# Patient Record
Sex: Female | Born: 1955 | Race: White | Hispanic: No | Marital: Married | Smoking: Never smoker
Health system: Southern US, Community
[De-identification: ages and names within clinical notes are randomized; demographics above are authoritative.]

## PROBLEM LIST (undated history)

## (undated) DIAGNOSIS — D0461 Carcinoma in situ of skin of right upper limb, including shoulder: Secondary | ICD-10-CM

## (undated) DIAGNOSIS — G4700 Insomnia, unspecified: Secondary | ICD-10-CM

## (undated) DIAGNOSIS — M1990 Unspecified osteoarthritis, unspecified site: Secondary | ICD-10-CM

## (undated) DIAGNOSIS — F39 Unspecified mood [affective] disorder: Secondary | ICD-10-CM

## (undated) DIAGNOSIS — I340 Nonrheumatic mitral (valve) insufficiency: Secondary | ICD-10-CM

## (undated) DIAGNOSIS — G4720 Circadian rhythm sleep disorder, unspecified type: Secondary | ICD-10-CM

## (undated) DIAGNOSIS — G609 Hereditary and idiopathic neuropathy, unspecified: Secondary | ICD-10-CM

## (undated) DIAGNOSIS — I341 Nonrheumatic mitral (valve) prolapse: Secondary | ICD-10-CM

## (undated) HISTORY — DX: Insomnia, unspecified: G47.00

## (undated) HISTORY — DX: Unspecified mood (affective) disorder: F39

## (undated) HISTORY — DX: Nonrheumatic mitral (valve) insufficiency: I34.0

## (undated) HISTORY — PX: CHOLECYSTECTOMY: SHX55

## (undated) HISTORY — DX: Circadian rhythm sleep disorder, unspecified type: G47.20

## (undated) HISTORY — DX: Unspecified osteoarthritis, unspecified site: M19.90

## (undated) HISTORY — DX: Carcinoma in situ of skin of right upper limb, including shoulder: D04.61

## (undated) HISTORY — DX: Hereditary and idiopathic neuropathy, unspecified: G60.9

## (undated) HISTORY — PX: JOINT REPLACEMENT: SHX530

## (undated) HISTORY — DX: Nonrheumatic mitral (valve) prolapse: I34.1

---

## 1997-12-27 HISTORY — PX: VAGINAL HYSTERECTOMY: SUR661

## 1998-07-16 ENCOUNTER — Ambulatory Visit (HOSPITAL_COMMUNITY): Admission: RE | Admit: 1998-07-16 | Discharge: 1998-07-16 | Payer: Self-pay | Admitting: Obstetrics & Gynecology

## 1999-08-17 ENCOUNTER — Other Ambulatory Visit: Admission: RE | Admit: 1999-08-17 | Discharge: 1999-08-17 | Payer: Self-pay | Admitting: Obstetrics & Gynecology

## 2000-08-11 ENCOUNTER — Other Ambulatory Visit: Admission: RE | Admit: 2000-08-11 | Discharge: 2000-08-11 | Payer: Self-pay | Admitting: Obstetrics & Gynecology

## 2001-11-02 ENCOUNTER — Other Ambulatory Visit: Admission: RE | Admit: 2001-11-02 | Discharge: 2001-11-02 | Payer: Self-pay | Admitting: Obstetrics & Gynecology

## 2002-11-09 ENCOUNTER — Other Ambulatory Visit: Admission: RE | Admit: 2002-11-09 | Discharge: 2002-11-09 | Payer: Self-pay | Admitting: Obstetrics & Gynecology

## 2003-11-18 ENCOUNTER — Other Ambulatory Visit: Admission: RE | Admit: 2003-11-18 | Discharge: 2003-11-18 | Payer: Self-pay | Admitting: Obstetrics & Gynecology

## 2004-12-09 ENCOUNTER — Other Ambulatory Visit: Admission: RE | Admit: 2004-12-09 | Discharge: 2004-12-09 | Payer: Self-pay | Admitting: Obstetrics & Gynecology

## 2006-01-06 ENCOUNTER — Other Ambulatory Visit: Admission: RE | Admit: 2006-01-06 | Discharge: 2006-01-06 | Payer: Self-pay | Admitting: Obstetrics & Gynecology

## 2008-03-08 ENCOUNTER — Encounter: Admission: RE | Admit: 2008-03-08 | Discharge: 2008-03-08 | Payer: Self-pay | Admitting: Obstetrics & Gynecology

## 2010-03-20 ENCOUNTER — Encounter: Admission: RE | Admit: 2010-03-20 | Discharge: 2010-03-20 | Payer: Self-pay | Admitting: Obstetrics & Gynecology

## 2013-02-21 ENCOUNTER — Other Ambulatory Visit: Payer: Self-pay | Admitting: Family Medicine

## 2013-02-21 DIAGNOSIS — N644 Mastodynia: Secondary | ICD-10-CM

## 2013-03-07 ENCOUNTER — Ambulatory Visit
Admission: RE | Admit: 2013-03-07 | Discharge: 2013-03-07 | Disposition: A | Discharge status: Home / Self Care | Payer: BC Managed Care – PPO | Source: Ambulatory Visit | Attending: Family Medicine | Admitting: Family Medicine

## 2013-03-07 ENCOUNTER — Other Ambulatory Visit: Payer: Self-pay | Admitting: Family Medicine

## 2013-03-07 DIAGNOSIS — N644 Mastodynia: Secondary | ICD-10-CM

## 2013-03-09 ENCOUNTER — Ambulatory Visit
Admission: RE | Admit: 2013-03-09 | Discharge: 2013-03-09 | Disposition: A | Discharge status: Home / Self Care | Payer: BC Managed Care – PPO | Source: Ambulatory Visit | Attending: Family Medicine | Admitting: Family Medicine

## 2013-03-09 DIAGNOSIS — N644 Mastodynia: Secondary | ICD-10-CM

## 2013-03-09 HISTORY — PX: LYMPH NODE BIOPSY: SHX201

## 2015-07-10 ENCOUNTER — Other Ambulatory Visit: Payer: Self-pay | Admitting: Obstetrics & Gynecology

## 2015-07-11 LAB — CYTOLOGY - PAP

## 2017-07-14 ENCOUNTER — Other Ambulatory Visit: Payer: Self-pay | Admitting: Obstetrics & Gynecology

## 2017-07-14 DIAGNOSIS — R928 Other abnormal and inconclusive findings on diagnostic imaging of breast: Secondary | ICD-10-CM

## 2017-07-22 ENCOUNTER — Ambulatory Visit
Admission: RE | Admit: 2017-07-22 | Discharge: 2017-07-22 | Disposition: A | Discharge status: Home / Self Care | Payer: BC Managed Care – PPO | Source: Ambulatory Visit | Attending: Obstetrics & Gynecology | Admitting: Obstetrics & Gynecology

## 2017-07-22 ENCOUNTER — Other Ambulatory Visit: Payer: Self-pay | Admitting: Obstetrics & Gynecology

## 2017-07-22 DIAGNOSIS — R928 Other abnormal and inconclusive findings on diagnostic imaging of breast: Secondary | ICD-10-CM

## 2017-07-22 DIAGNOSIS — N6320 Unspecified lump in the left breast, unspecified quadrant: Secondary | ICD-10-CM

## 2018-01-04 ENCOUNTER — Encounter: Payer: Self-pay | Admitting: Cardiology

## 2018-01-04 DIAGNOSIS — R079 Chest pain, unspecified: Secondary | ICD-10-CM | POA: Diagnosis not present

## 2018-11-02 NOTE — Progress Notes (Signed)
Cardiology Office Note:    Date:  11/03/2018   ID:  Felicia Shields, DOB 03/16/1956, MRN [id number]  PCP:  Mateo Flow, MD  Cardiologist:  Shirlee More, MD   Referring MD: Mateo Flow, MD  ASSESSMENT:    1. MVP (mitral valve prolapse)   2. Fatigue, unspecified type    PLAN:    In order of problems listed above:  1. She has mitral valve prolapse clinically has mild regurgitation now offered her reassurance to see back in a year we will recheck echocardiogram but I did ask her to start endocarditis prophylaxis with more than recurrent mild regurgitation by echo.  Certainly people can have atypical chest pain with prolapse but I do not think is the cause of her symptoms.  If she had more typical Jonny should benefit from cardiac CTA at this time I do not think she would needs to wear heart rhythm monitor. 2. Multiple potential etiologies occluding her sleep disturbance centrally active drugs including Ambien Neurontin Klonopin depression.  Next appointment 1 year   Medication Adjustments/Labs and Tests Ordered: Current medicines are reviewed at length with the patient today.  Concerns regarding medicines are outlined above.  No orders of the defined types were placed in this encounter.  No orders of the defined types were placed in this encounter.    Chief Complaint  Patient presents with  . Mitral Valve Prolapse    History of Present Illness:    Felicia Shields is a 62 y.o. female who is being seen today for the evaluation of atrial regurgitation at the request of Mateo Flow, MD.  She is had a difficult year with deaths in the family.  Associated with grief she had nonexertional chest pain underwent a stress echo [facility] which was negative for ischemia but showed posterior leaflet prolapse was felt to be moderate regurgitation.  Subsequently another family member died of stroke due to atrial fibrillation has made her very concerned about her cardiac prognosis.   He has fatigue which is a chronic problem has sleep disturbance is not having typical angina shortness of breath edema or syncope she has palpitation intermittently not severe not sustained. Past Medical History:  Diagnosis Date  . 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 Episodic mood disorder ([place])   . Idiopathic peripheral neuropathy   . Mitral regurgitation   . MVP (mitral valve prolapse)   . 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 CHOLECYSTECTOMY    . JOINT REPLACEMENT     Thumb surgery  . LYMPH NODE BIOPSY  03/09/2013  . VAGINAL HYSTERECTOMY  1999    Current Medications: Current Meds  Medication Sig  . calcium carbonate (OS-CAL) 600 MG tablet Take 1 tablet by mouth daily.  . cetirizine (ZYRTEC) 10 MG tablet Take 10 mg by mouth daily as needed for allergies.  . Cholecalciferol (VITAMIN D3) 50 MCG (2000 UT) capsule Take 2,000 Units by mouth daily.  . clonazePAM (KLONOPIN) 0.5 MG tablet Take 0.5 mg by mouth at bedtime.  . COLLAGEN PO Take 1,000 mg by mouth daily.  Marland Kitchen estradiol (ESTRACE) 2 MG tablet Take 2 mg by mouth daily.   Marland Kitchen gabapentin (NEURONTIN) 300 MG capsule Take 1-2 capsules by mouth at bedtime.  . magnesium oxide (MAG-OX) 400 MG tablet Take 400 mg by mouth 2 (two) times daily.   . meloxicam (MOBIC) 15  MG tablet Take 1 tablet by mouth as needed.  . Multiple Vitamin (MULTIVITAMIN) tablet Take 1 tablet by mouth daily.  . Turmeric 500 MG TABS Take 1 tablet by mouth daily.   Marland Kitchen zolpidem (AMBIEN) 10 MG tablet Take 10 mg by mouth at bedtime as needed.     Allergies:   Patient has no known allergies.   Social History   Socioeconomic History  . Marital status: Married    Spouse name: Not on file  . Number of children: 2  . Years of education: Not on file  . Highest education level: Not on file  Occupational History  . 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 Financial resource strain: Not  on file  . Food insecurity:    Worry: Not on file    Inability: Not on file  . Transportation needs:    Medical: Not on file    Non-medical: Not on file  Tobacco Use  . Smoking status: Never Smoker  . Smokeless tobacco: Never Used  Substance and Sexual Activity  . Alcohol use: Yes    Comment: "very little"  . Drug use: Never  . Sexual activity: Not on file  Lifestyle  . Physical activity:    Days per week: Not on file    Minutes per session: Not on file  . Stress: Not on file  Relationships  . Social connections:    Talks on phone: Not on file    Gets together: Not on file    Attends religious service: Not on file    Active member of club or organization: Not on file    Attends meetings of clubs or organizations: Not on file    Relationship status: Not on file  Other Topics Concern  . Not on file  Social History Narrative  . Not on file     Family History: The patient's family history includes Diabetes Mellitus II in her mother; Stroke in her brother.  ROS:   Review of Systems  Constitution: Positive for malaise/fatigue.  HENT: Negative.   Eyes: Negative.   Cardiovascular: Positive for chest pain and palpitations.  Respiratory: Negative.   Endocrine: Negative.   Hematologic/Lymphatic: Negative.   Skin: Negative.   Musculoskeletal: Positive for joint pain and myalgias.  Gastrointestinal: Negative.   Genitourinary: Negative.   Neurological: Negative.   Psychiatric/Behavioral: Positive for depression.  Allergic/Immunologic: Negative.    Please see the history of present illness.     All other systems reviewed and are negative.  EKGs/Labs/Other Studies Reviewed:    The following studies were reviewed today:   EKG:  EKG is  ordered today.  The ekg ordered today demonstrates sinus rhythm normal  Recent Labs: No results found for requested labs within last 8760 hours.  Recent Lipid Panel No results found for: CHOL, TRIG, HDL, CHOLHDL, VLDL, LDLCALC,  LDLDIRECT  Physical Exam:    VS:  BP 110/60 (BP Location: Left Arm, Patient Position: Sitting, Cuff Size: Normal)   Pulse 71   Ht 5\' 8"  (1.727 m)   Wt 168 lb 12.8 oz (76.6 kg)   SpO2 98%   BMI 25.67 kg/m     Wt Readings from Last 3 Encounters:  11/03/18 168 lb 12.8 oz (76.6 kg)     GEN:  Well nourished, well developed in no acute distress HEENT: Normal NECK: No JVD; No carotid bruits LYMPHATICS: No lymphadenopathy CARDIAC: 1/6 MR at [facility], no murmurs, rubs, gallops RESPIRATORY:  Clear to auscultation without rales, wheezing or  rhonchi  ABDOMEN: Soft, non-tender, non-distended MUSCULOSKELETAL:  No edema; No deformity  SKIN: Warm and dry NEUROLOGIC:  Alert and oriented x 3 PSYCHIATRIC:  Normal affect     Signed, Shirlee More, MD  11/03/2018 2:28 PM    [place]

## 2018-11-03 ENCOUNTER — Encounter: Payer: Self-pay | Admitting: *Deleted

## 2018-11-03 ENCOUNTER — Ambulatory Visit: Payer: BC Managed Care – PPO | Admitting: Cardiology

## 2018-11-03 ENCOUNTER — Encounter: Payer: Self-pay | Admitting: Cardiology

## 2018-11-03 VITALS — BP 110/60 | HR 71 | Ht 68.0 in | Wt 168.8 lb

## 2018-11-03 DIAGNOSIS — R5383 Other fatigue: Secondary | ICD-10-CM | POA: Diagnosis not present

## 2018-11-03 DIAGNOSIS — R079 Chest pain, unspecified: Secondary | ICD-10-CM

## 2018-11-03 DIAGNOSIS — I340 Nonrheumatic mitral (valve) insufficiency: Secondary | ICD-10-CM

## 2018-11-03 DIAGNOSIS — I341 Nonrheumatic mitral (valve) prolapse: Secondary | ICD-10-CM

## 2018-11-03 HISTORY — DX: Other fatigue: R53.83

## 2018-11-03 HISTORY — DX: Chest pain, unspecified: R07.9

## 2018-11-03 MED ORDER — AMOXICILLIN 500 MG PO TABS: ORAL_TABLET | ORAL | 0 refills | Status: DC

## 2018-11-03 NOTE — Patient Instructions (Addendum)
Medication Instructions:  Your physician has recommended you make the following change in your medication:  START amoxicillin 500 mg: Take 4 tablets (2,000 mg) 45 minutes before dental cleaning/procedures  If you need a refill on your cardiac medications before your next appointment, please call your pharmacy.   Lab work: None  If you have labs (blood work) drawn today and your tests are completely normal, you will receive your results only by: Marland Kitchen MyChart Message (if you have MyChart) OR . A paper copy in the mail If you have any lab test that is abnormal or we need to change your treatment, we will call you to review the results.  Testing/Procedures: You had an EKG today.   Follow-Up: At [facility], you and your health needs are our priority.  As part of our continuing mission to provide you with exceptional heart care, we have created designated Provider Care Teams.  These Care Teams include your primary Cardiologist (physician) and Advanced Practice Providers (APPs -  Physician Assistants and Nurse Practitioners) who all work together to provide you with the care you need, when you need it. You will need a follow up appointment in 1 years.  Please call our office 2 months in advance to schedule this appointment.        Mitral Valve Prolapse Mitral valve prolapse is a heart condition involving the mitral valve. This is the valve between the upper chamber (atrium) and the lower chamber (ventricle) on the left side of the heart. Normally, the mitral valve allows blood to flow from the atrium to the ventricle and then seals off the chambers from one another. If you have mitral valve prolapse, the valve does not work the way that it should. The flaps of the mitral valve do not form a tight seal between the atrium and the ventricle. When this happens, blood can flow the wrong way (mitral valve regurgitation). This causes symptoms of mitral valve prolapse. This condition can develop  if:  The mitral valve flaps are larger and thicker than normal.  The valve opening stretches abnormally.  The valve flaps flop or bulge more than they should.  What are the causes? The cause of this condition is not known. However:  It is sometimes passed down (inherited) from a family member who also had the condition.  It can be a complication of other diseases.  What increases the risk? This condition is more like to develop in people with:  A family history of mitral valve prolapse.  Muscular dystrophy.  Graves disease.  Scoliosis.  A connective tissue disorder.  What are the signs or symptoms? Symptoms of this condition include:  A fast or irregular heartbeat (palpitations).  Fatigue.  Dizziness.  Shortness of breath.  Discomfort in the chest area.  In some cases, there are no symptoms for this condition. How is this diagnosed? This condition may be diagnosed based on:  Your symptoms and medical history.  A physical exam, which includes listening to your heart with a stethoscope.  Other tests to confirm the diagnosis. These may include imaging studies of your heart, such as: ? X-rays. These check for fluid in the lungs. ? Echocardiogram. This test uses sound waves to show the size of your heart and how well it pumps. ? Doppler ultrasound. This test uses sound waves to take pictures of the blood flow through your valve. ? Electrocardiogram (ECG). This test records the electrical activity of your heart.  How is this treated? Treatment for this  condition depends on how severe your symptoms are. If you need treatment, the goal is to relieve symptoms and prevent further problems, such as a type of heart infection called endocarditis. Treatment can include:  Medicines. You may need to take: ? Beta blockers. These help with chest discomfort and palpitations. ? Vasodilators. These improve forward blood flow through the valve, if it is leaking. ? Water pills  (diuretics). These get rid of any extra fluid that is present.  Surgery to repair or replace the mitral valve.  If you do not have symptoms, you may not need treatment. Follow these instructions at home:  Take over-the-counter and prescription medicines only as told by your health care provider.  Get regular exercise. Ask your health care provider to recommend some activities that are safe for you to do.  Do not use any products that contain nicotine or tobacco, such as cigarettes and e-cigarettes. If you need help quitting, ask your health care provider.  Avoid being around secondhand smoke.  Brush and floss your teeth every day. See a dentist regularly. Having unhealthy teeth and gums may make endocarditis more likely.  Keep all follow-up visits as told by your health care provider. This is important. Contact a health care provider if:  You have any kind of abnormal heartbeat.  You are often very tired.  You have a cough that will not go away.  Your symptoms of mitral valve prolapse begin to get worse. Get help right away if:  You have chest pain or shortness of breath.  You start to have chills, body aches, and a fever. This information is not intended to replace advice given to you by your health care provider. Make sure you discuss any questions you have with your health care provider. Document Released: 12/10/2000 Document Revised: 08/10/2016 Document Reviewed: 06/05/2016 Elsevier Interactive Patient Education  2018 [place] home BP and pulse a few times a week  1. Avoid all over-the-counter antihistamines except Claritin/Loratadine and Zyrtec/Cetrizine. 2. Avoid all combination including cold sinus allergies flu decongestant and sleep medications 3. You can use Robitussin DM Mucinex and Mucinex DM for cough. 4. can use Tylenol aspirin ibuprofen and naproxen but no combinations such as sleep or sinus.

## 2018-11-16 ENCOUNTER — Ambulatory Visit: Payer: BC Managed Care – PPO | Admitting: Cardiology

## 2021-09-09 ENCOUNTER — Other Ambulatory Visit: Payer: Self-pay

## 2021-09-09 DIAGNOSIS — F39 Unspecified mood [affective] disorder: Secondary | ICD-10-CM | POA: Insufficient documentation

## 2021-09-09 DIAGNOSIS — G4700 Insomnia, unspecified: Secondary | ICD-10-CM | POA: Insufficient documentation

## 2021-09-09 DIAGNOSIS — G4720 Circadian rhythm sleep disorder, unspecified type: Secondary | ICD-10-CM | POA: Insufficient documentation

## 2021-09-09 DIAGNOSIS — M1990 Unspecified osteoarthritis, unspecified site: Secondary | ICD-10-CM | POA: Insufficient documentation

## 2021-09-09 DIAGNOSIS — D0461 Carcinoma in situ of skin of right upper limb, including shoulder: Secondary | ICD-10-CM | POA: Insufficient documentation

## 2021-09-09 DIAGNOSIS — G609 Hereditary and idiopathic neuropathy, unspecified: Secondary | ICD-10-CM | POA: Insufficient documentation

## 2021-09-15 ENCOUNTER — Encounter: Payer: Self-pay | Admitting: Cardiology

## 2021-09-15 ENCOUNTER — Ambulatory Visit (INDEPENDENT_AMBULATORY_CARE_PROVIDER_SITE_OTHER): Payer: 59 | Admitting: Cardiology

## 2021-09-15 ENCOUNTER — Other Ambulatory Visit: Payer: Self-pay

## 2021-09-15 VITALS — BP 122/68 | HR 83 | Ht 69.0 in | Wt 167.4 lb

## 2021-09-15 DIAGNOSIS — R011 Cardiac murmur, unspecified: Secondary | ICD-10-CM | POA: Diagnosis not present

## 2021-09-15 DIAGNOSIS — I340 Nonrheumatic mitral (valve) insufficiency: Secondary | ICD-10-CM | POA: Diagnosis not present

## 2021-09-15 DIAGNOSIS — E782 Mixed hyperlipidemia: Secondary | ICD-10-CM

## 2021-09-15 NOTE — Patient Instructions (Signed)
Medication Instructions:  No medication changes. *If you need a refill on your cardiac medications before your next appointment, please call your pharmacy*   Lab Work: None ordered If you have labs (blood work) drawn today and your tests are completely normal, you will receive your results only by: [place] (if you have MyChart) OR A paper copy in the mail If you have any lab test that is abnormal or we need to change your treatment, we will call you to review the results.   Testing/Procedures: Your physician has requested that you have an echocardiogram. Echocardiography is a painless test that uses sound waves to create images of your heart. It provides your doctor with information about the size and shape of your heart and how well your heart's chambers and valves are working. This procedure takes approximately one hour. There are no restrictions for this procedure.  We will order CT coronary calcium score. It will cost $99.00 and is not covered by insurance.  Please call [phone] to schedule.   [facility]  [address], [place] [address]   Follow-Up: At [facility], you and your health needs are our priority.  As part of our continuing mission to provide you with exceptional heart care, we have created designated Provider Care Teams.  These Care Teams include your primary Cardiologist (physician) and Advanced Practice Providers (APPs -  Physician Assistants and Nurse Practitioners) who all work together to provide you with the care you need, when you need it.  We recommend signing up for the patient portal called "MyChart".  Sign up information is provided on this After Visit Summary.  MyChart is used to connect with patients for Virtual Visits (Telemedicine).  Patients are able to view lab/test results, encounter notes, upcoming appointments, etc.  Non-urgent messages can be sent to your provider as well.   To learn more about what you can do with  MyChart, go to [email or web address].    Your next appointment:   6 month(s)  The format for your next appointment:   In Person  Provider:   Jyl Heinz, MD   Other Instructions Echocardiogram An echocardiogram is a test that uses sound waves (ultrasound) to produce images of the heart. Images from an echocardiogram can provide important information about: Heart size and shape. The size and thickness and movement of your heart's walls. Heart muscle function and strength. Heart valve function or if you have stenosis. Stenosis is when the heart valves are too narrow. If blood is flowing backward through the heart valves (regurgitation). A tumor or infectious growth around the heart valves. Areas of heart muscle that are not working well because of poor blood flow or injury from a heart attack. Aneurysm detection. An aneurysm is a weak or damaged part of an artery wall. The wall bulges out from the normal force of blood pumping through the body. Tell a health care provider about: Any allergies you have. All medicines you are taking, including vitamins, herbs, eye drops, creams, and over-the-counter medicines. Any blood disorders you have. Any surgeries you have had. Any medical conditions you have. Whether you are pregnant or may be pregnant. What are the risks? Generally, this is a safe test. However, problems may occur, including an allergic reaction to dye (contrast) that may be used during the test. What happens before the test? No specific preparation is needed. You may eat and drink normally. What happens during the test? You will take off your clothes  from the waist up and put on a hospital gown. Electrodes or electrocardiogram (ECG)patches may be placed on your chest. The electrodes or patches are then connected to a device that monitors your heart rate and rhythm. You will lie down on a table for an ultrasound exam. A gel will be applied to your chest to help sound  waves pass through your skin. A handheld device, called a transducer, will be pressed against your chest and moved over your heart. The transducer produces sound waves that travel to your heart and bounce back (or "echo" back) to the transducer. These sound waves will be captured in real-time and changed into images of your heart that can be viewed on a video monitor. The images will be recorded on a computer and reviewed by your health care provider. You may be asked to change positions or hold your breath for a short time. This makes it easier to get different views or better views of your heart. In some cases, you may receive contrast through an IV in one of your veins. This can improve the quality of the pictures from your heart. The procedure may vary among health care providers and hospitals.   What can I expect after the test? You may return to your normal, everyday life, including diet, activities, and medicines, unless your health care provider tells you not to do that. Follow these instructions at home: It is up to you to get the results of your test. Ask your health care provider, or the department that is doing the test, when your results will be ready. Keep all follow-up visits. This is important. Summary An echocardiogram is a test that uses sound waves (ultrasound) to produce images of the heart. Images from an echocardiogram can provide important information about the size and shape of your heart, heart muscle function, heart valve function, and other possible heart problems. You do not need to do anything to prepare before this test. You may eat and drink normally. After the echocardiogram is completed, you may return to your normal, everyday life, unless your health care provider tells you not to do that. This information is not intended to replace advice given to you by your health care provider. Make sure you discuss any questions you have with your health care provider. Document  Revised: 08/05/2020 Document Reviewed: 08/05/2020 Elsevier Patient Education  2021 [employer].

## 2021-09-15 NOTE — Progress Notes (Signed)
Cardiology Office Note:    Date:  09/15/2021   ID:  Felicia Shields, DOB [birth year], MRN [id number]  PCP:  Mateo Flow, MD  Cardiologist:  Jenean Lindau, MD   Referring MD: Mateo Flow, MD    ASSESSMENT:    1. Mitral valve insufficiency, unspecified etiology   2. Cardiac murmur   3. Mixed dyslipidemia    PLAN:    In order of problems listed above:  Primary prevention stressed with patient.  Importance of compliance with diet medication stressed and she vocalized understanding.  She was advised to walk at least half an hour a day 5 days a week and she promises to do so. Mixed dyslipidemia: Lipids were reviewed and I would like her to get a calcium scoring CT scan for risk stratification.  Diet was emphasized. Cardiac murmur: Patient has history of mitral regurgitation and I will get an echocardiogram to assess murmur heard on auscultation. Patient will be seen in follow-up appointment in 6 months or earlier if the patient has any concerns    Medication Adjustments/Labs and Tests Ordered: Current medicines are reviewed at length with the patient today.  Concerns regarding medicines are outlined above.  No orders of the defined types were placed in this encounter.  No orders of the defined types were placed in this encounter.    History of Present Illness:    Felicia Shields is a 65 y.o. female who is being seen today for the evaluation of cardiac murmur at the request of Mateo Flow, MD. patient is a pleasant 65 year old female.  She has past medical history of cardiac murmur and mitral regurgitation.  Patient also has history of mixed dyslipidemia.  She is here for evaluation for cardiac,.  She leads a sedentary lifestyle.  She denies any chest pain orthopnea or PND.  At the time of my evaluation, the patient is alert awake oriented and in no distress.  No history of syncope no history of diabetes mellitus or smoking.  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   Biotin (BIOTIN 5000) 5 MG CAPS Take 5 mg by mouth daily.   calcium carbonate (OS-CAL) 600 MG tablet Take 1 tablet by mouth daily.   cetirizine (ZYRTEC) 10 MG tablet Take 10 mg by mouth daily as needed for allergies.   Cholecalciferol (D3 5000) 125 MCG (5000 UT) capsule Take 5,000 Units by mouth daily.   clonazePAM (KLONOPIN) 1 MG tablet Take 1 mg by mouth at bedtime as needed for sleep.   magnesium oxide (MAG-OX) 400 MG tablet Take 400 mg by mouth 2 (two) times daily.    Melatonin 10 MG CAPS Take 10 mg by mouth at bedtime.   meloxicam (MOBIC) 15 MG tablet  Take 1 tablet by mouth as needed for pain.   Multiple Vitamin (MULTIVITAMIN) tablet Take 1 tablet by mouth daily.   Omega-3 1000 MG CAPS Take 1,000 mg by mouth daily.   zolpidem (AMBIEN) 10 MG tablet Take 10 mg by mouth at bedtime as needed for sleep.     Allergies:   Patient has no known allergies.   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  Tobacco Use   Smoking status: Never   Smokeless tobacco: Never  Vaping Use   Vaping Use: Never used  Substance and Sexual Activity   Alcohol use: Yes    Comment: "very little"   Drug  use: Never   Sexual activity: Not on file  Other Topics Concern   Not on file  Social History Narrative   Not on file   Social Determinants of Health   Financial Resource Strain: Not on file  Food Insecurity: Not on file  Transportation Needs: Not on file  Physical Activity: Not on file  Stress: Not on file  Social Connections: Not on file     Family History: The patient's family history includes Diabetes Mellitus II in her mother; Hypertension in her mother; Stroke in her brother. There is no history of Cancer or Heart disease.  ROS:   Please see the history of present illness.    All other systems reviewed and are negative.  EKGs/Labs/Other Studies Reviewed:    The following studies were reviewed today: EKG reveals sinus rhythm with nonspecific ST-T changes    Recent Labs: No results found for requested labs within last 8760 hours.  Recent Lipid Panel No results found for: CHOL, TRIG, HDL, CHOLHDL, VLDL, LDLCALC, LDLDIRECT  Physical Exam:    VS:  BP 122/68   Pulse 83   Ht 5\' 9"  (1.753 m)   Wt 167 lb 6.4 oz (75.9 kg)   SpO2 96%   BMI 24.72 kg/m     Wt Readings from Last 3 Encounters:  09/15/21 167 lb 6.4 oz (75.9 kg)  11/03/18 168 lb 12.8 oz (76.6 kg)     GEN: Patient is in no acute distress HEENT: Normal NECK: No JVD; No carotid bruits LYMPHATICS: No lymphadenopathy CARDIAC: S1 S2 regular, 2/6 systolic murmur at the apex. RESPIRATORY:  Clear to auscultation without rales, wheezing or rhonchi  ABDOMEN: Soft, non-tender, non-distended MUSCULOSKELETAL:  No edema; No deformity  SKIN: Warm and dry NEUROLOGIC:  Alert and oriented x 3 PSYCHIATRIC:  Normal affect    Signed, Jenean Lindau, MD  09/15/2021 3:05 PM    [facility]

## 2021-09-22 ENCOUNTER — Telehealth: Payer: Self-pay | Admitting: Cardiology

## 2021-09-22 NOTE — Telephone Encounter (Signed)
FYI--Patient states she would prefer to have Dr. Bettina Gavia read her upcoming echocardiogram if possible. She states she does not wish to follow up with Dr. Geraldo Pitter at this time.

## 2021-09-29 ENCOUNTER — Other Ambulatory Visit: Payer: 59

## 2021-10-08 ENCOUNTER — Ambulatory Visit (INDEPENDENT_AMBULATORY_CARE_PROVIDER_SITE_OTHER)
Admission: RE | Admit: 2021-10-08 | Discharge: 2021-10-08 | Disposition: A | Discharge status: Home / Self Care | Payer: Self-pay | Source: Ambulatory Visit | Attending: Cardiology | Admitting: Cardiology

## 2021-10-08 ENCOUNTER — Other Ambulatory Visit: Payer: Self-pay

## 2021-10-08 DIAGNOSIS — I340 Nonrheumatic mitral (valve) insufficiency: Secondary | ICD-10-CM

## 2021-10-08 DIAGNOSIS — E782 Mixed hyperlipidemia: Secondary | ICD-10-CM

## 2021-10-09 ENCOUNTER — Ambulatory Visit (INDEPENDENT_AMBULATORY_CARE_PROVIDER_SITE_OTHER): Payer: 59

## 2021-10-09 DIAGNOSIS — I340 Nonrheumatic mitral (valve) insufficiency: Secondary | ICD-10-CM

## 2021-10-09 DIAGNOSIS — R011 Cardiac murmur, unspecified: Secondary | ICD-10-CM | POA: Diagnosis not present

## 2021-10-09 LAB — ECHOCARDIOGRAM COMPLETE
Area-P 1/2: 4.86 cm2
S' Lateral: 2.8 cm

## 2021-11-17 ENCOUNTER — Telehealth: Payer: Self-pay | Admitting: Cardiology

## 2021-11-17 NOTE — Telephone Encounter (Signed)
New Message:      Patient would like to switch from Dr Geraldo Pitter to  Dr Bettina Gavia. Is this alright with both of you?

## 2021-12-15 ENCOUNTER — Other Ambulatory Visit: Payer: Self-pay | Admitting: Obstetrics & Gynecology

## 2021-12-15 DIAGNOSIS — R928 Other abnormal and inconclusive findings on diagnostic imaging of breast: Secondary | ICD-10-CM

## 2022-01-18 ENCOUNTER — Ambulatory Visit
Admission: RE | Admit: 2022-01-18 | Discharge: 2022-01-18 | Disposition: A | Discharge status: Home / Self Care | Payer: 59 | Source: Ambulatory Visit | Attending: Obstetrics & Gynecology | Admitting: Obstetrics & Gynecology

## 2022-01-18 DIAGNOSIS — R922 Inconclusive mammogram: Secondary | ICD-10-CM | POA: Diagnosis not present

## 2022-01-18 DIAGNOSIS — R928 Other abnormal and inconclusive findings on diagnostic imaging of breast: Secondary | ICD-10-CM

## 2022-03-16 ENCOUNTER — Ambulatory Visit: Payer: 59 | Admitting: Cardiology

## 2022-10-02 DIAGNOSIS — R509 Fever, unspecified: Secondary | ICD-10-CM | POA: Diagnosis not present

## 2022-10-02 DIAGNOSIS — H6691 Otitis media, unspecified, right ear: Secondary | ICD-10-CM | POA: Diagnosis not present

## 2022-10-02 DIAGNOSIS — R051 Acute cough: Secondary | ICD-10-CM | POA: Diagnosis not present

## 2023-01-11 DIAGNOSIS — Z1231 Encounter for screening mammogram for malignant neoplasm of breast: Secondary | ICD-10-CM | POA: Diagnosis not present

## 2023-01-12 DIAGNOSIS — D225 Melanocytic nevi of trunk: Secondary | ICD-10-CM | POA: Diagnosis not present

## 2023-01-12 DIAGNOSIS — L814 Other melanin hyperpigmentation: Secondary | ICD-10-CM | POA: Diagnosis not present

## 2023-01-12 DIAGNOSIS — L820 Inflamed seborrheic keratosis: Secondary | ICD-10-CM | POA: Diagnosis not present

## 2023-01-12 DIAGNOSIS — L821 Other seborrheic keratosis: Secondary | ICD-10-CM | POA: Diagnosis not present

## 2023-01-12 DIAGNOSIS — C44311 Basal cell carcinoma of skin of nose: Secondary | ICD-10-CM | POA: Diagnosis not present

## 2023-01-12 DIAGNOSIS — L578 Other skin changes due to chronic exposure to nonionizing radiation: Secondary | ICD-10-CM | POA: Diagnosis not present

## 2023-01-19 DIAGNOSIS — C441121 Basal cell carcinoma of skin of right upper eyelid, including canthus: Secondary | ICD-10-CM | POA: Diagnosis not present

## 2023-02-09 DIAGNOSIS — J069 Acute upper respiratory infection, unspecified: Secondary | ICD-10-CM | POA: Diagnosis not present

## 2023-05-04 DIAGNOSIS — L570 Actinic keratosis: Secondary | ICD-10-CM | POA: Diagnosis not present

## 2023-05-04 DIAGNOSIS — C441121 Basal cell carcinoma of skin of right upper eyelid, including canthus: Secondary | ICD-10-CM | POA: Diagnosis not present

## 2023-07-12 DIAGNOSIS — L570 Actinic keratosis: Secondary | ICD-10-CM | POA: Diagnosis not present

## 2023-07-12 DIAGNOSIS — L821 Other seborrheic keratosis: Secondary | ICD-10-CM | POA: Diagnosis not present

## 2023-07-17 IMAGING — CT CT CARDIAC CORONARY ARTERY CALCIUM SCORE
3 series · 14 of 20 positions shown, 16 images · non-contrast
Comparison: None
COMPARISON: None

Addendum:
EXAM:
OVER-READ INTERPRETATION  CT CHEST

The following report is an over-read performed by radiologist Dr.
over-read does not include interpretation of cardiac or coronary
anatomy or pathology. The calcium score interpretation by the
cardiologist is attached.
CLINICAL DATA: 64F for cardiovascular disease risk stratification
Coronary Calcium Score
TECHNIQUE: A gated, non-contrast computed tomography scan of the heart was
performed using 3mm slice thickness. Axial images were analyzed on a
dedicated workstation. Calcium scoring of the coronary arteries was
performed using the Agatston method.

[Series 2: cascseq 2.0 sa36 70% (id) · axial · 0.39mm/px · z∈[-249,-169]mm · 4 of 68 slices shown]
[im 14/68  vessel]
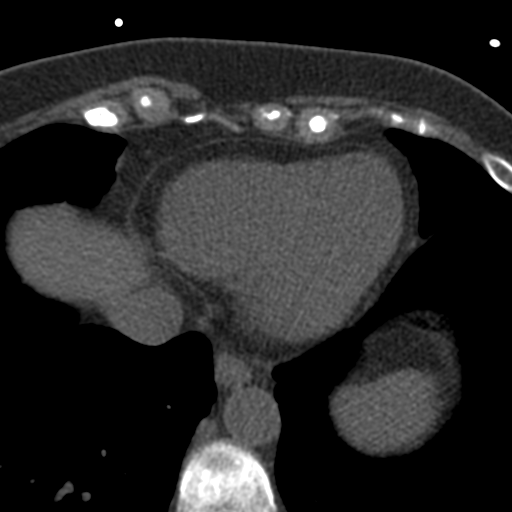
[im 27/68  vessel]
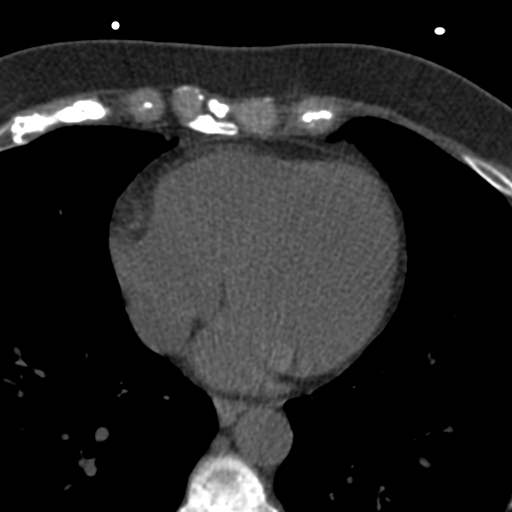
[im 41/68  vessel]
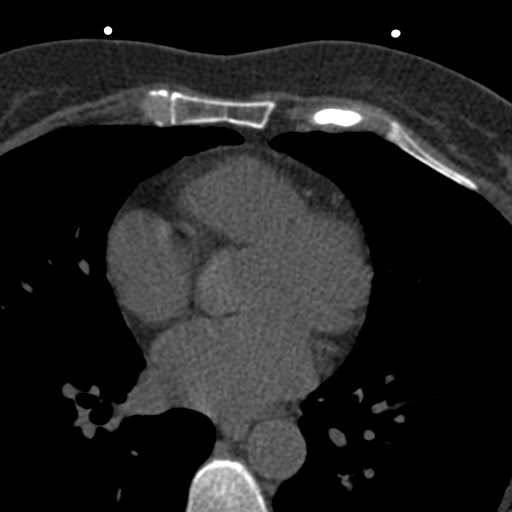
[im 54/68  vessel]
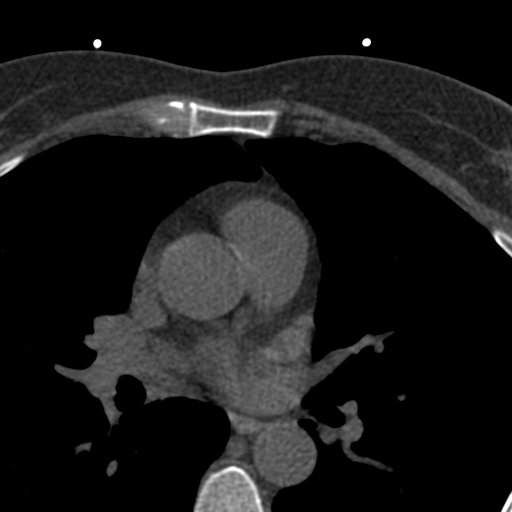

[Series 3: cascseq 2.0 bf37 st · axial · 0.72mm/px · z∈[-253,-165]mm · 5 of 68 slices shown, 7 images]
[im 12/68  vessel]
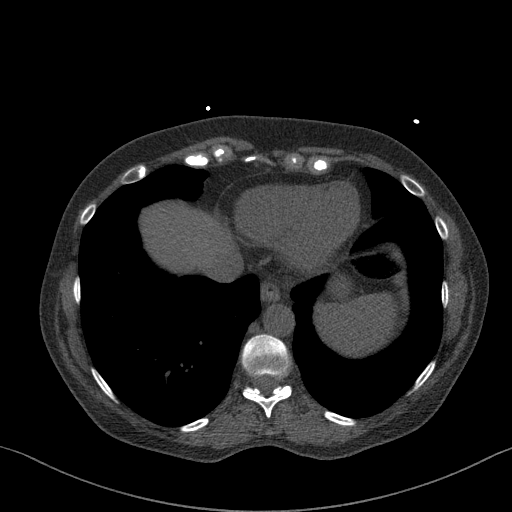
[im 12/68  lung]
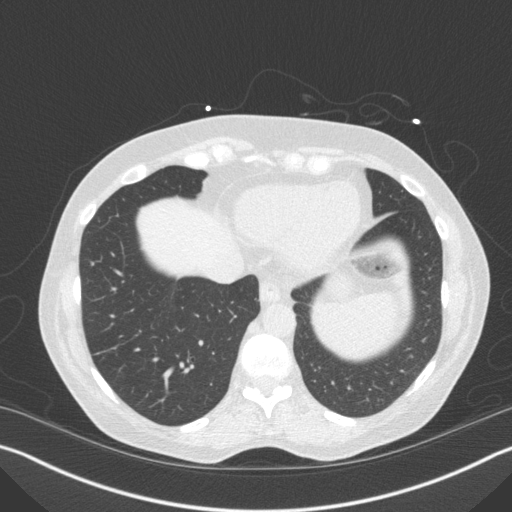
[im 23/68  vessel]
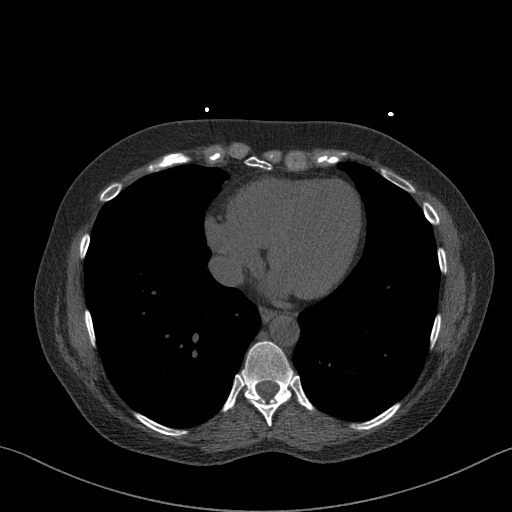
[im 34/68  vessel]
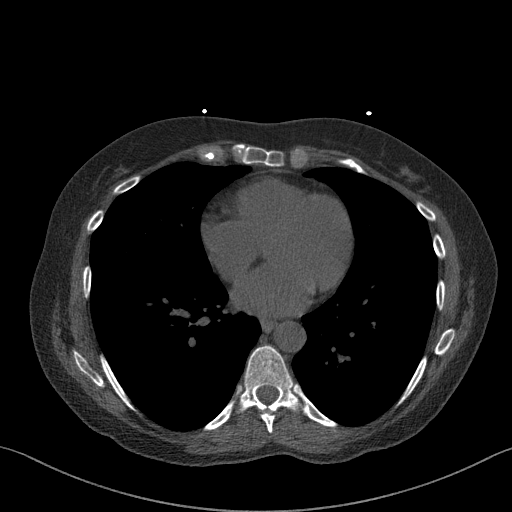
[im 45/68  vessel]
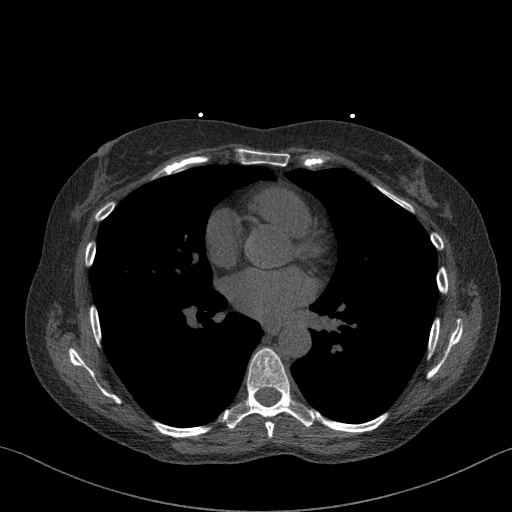
[im 56/68  vessel]
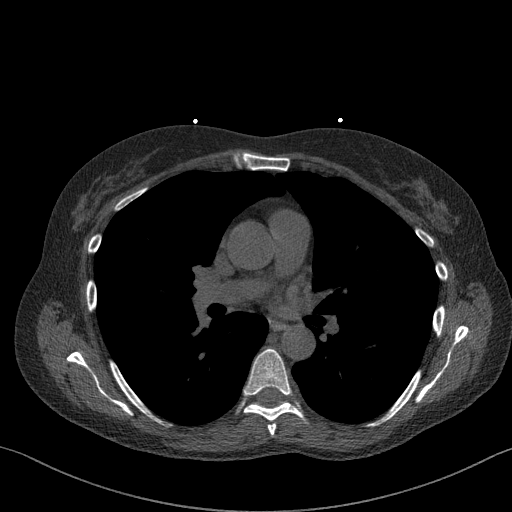
[im 56/68  lung]
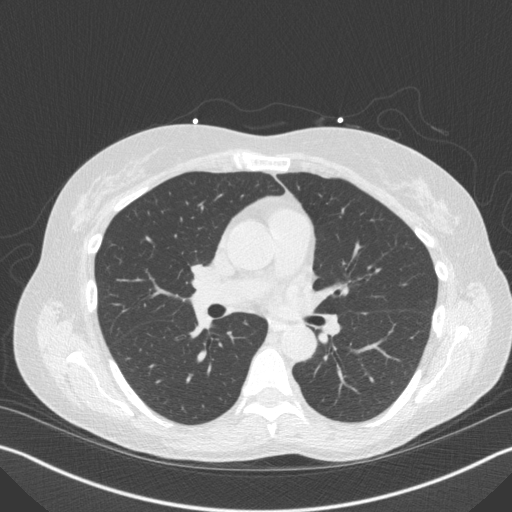

[Series 4: cascseq 2.0 br59 lung · axial · 0.68mm/px · z∈[-253,-165]mm · 5 of 68 slices shown]
[im 12/68  lung]
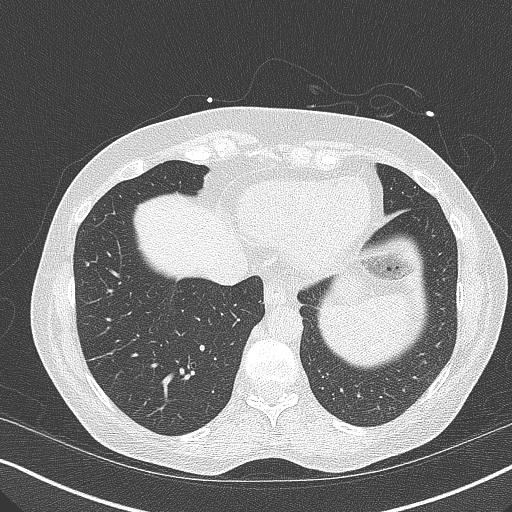
[im 23/68  lung]
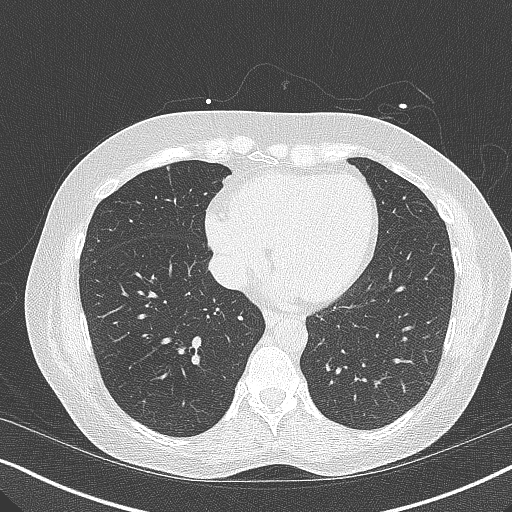
[im 34/68  lung]
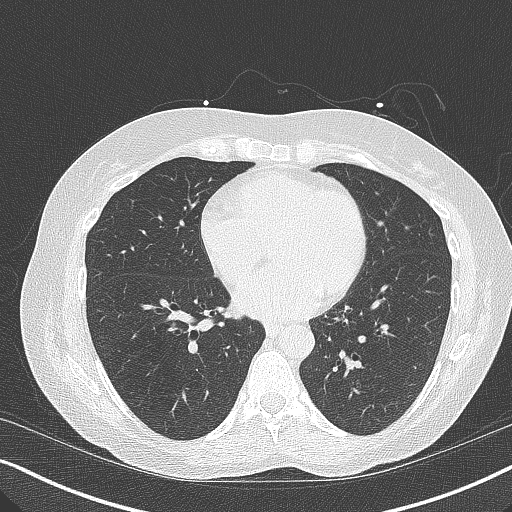
[im 45/68  lung]
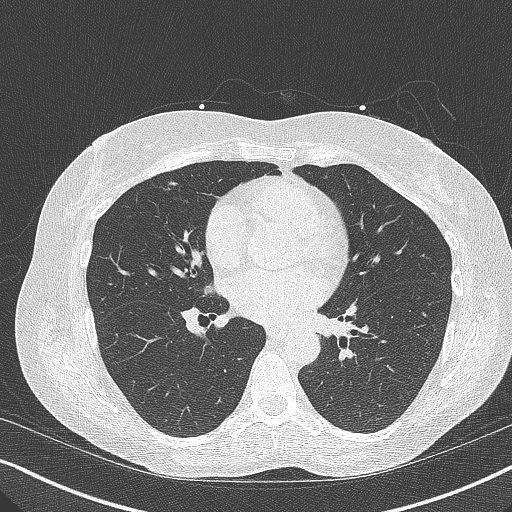
[im 56/68  lung]
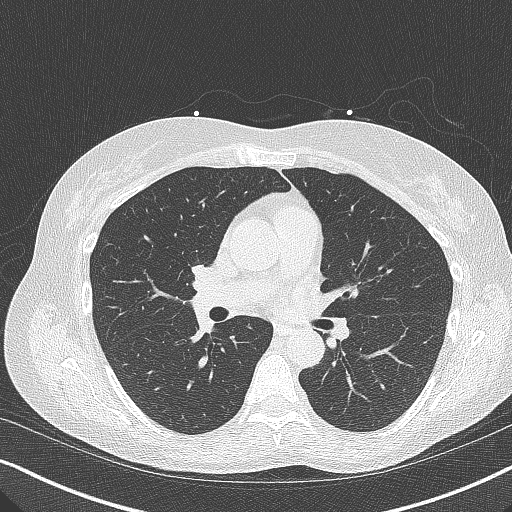

[14 of 20 positions shown; findings below may reference images not displayed]

FINDINGS: Vascular: Normal aortic caliber.

Mediastinum/Nodes: No imaged thoracic adenopathy.

Lungs/Pleura: No pleural fluid. Thickening along the left major
fissure.

Subpleural 4 mm left lower lobe pulmonary nodule on 47/4.

A right middle lobe 3 mm solid nodule on [DATE].

Left lower lobe scarring.

Upper Abdomen: Normal imaged portions of the liver, spleen, stomach.
Normal aortic caliber.

Musculoskeletal: No acute osseous abnormality.
IMPRESSION: 1. No acute process in the imaged extracardiac chest.
2. Tiny bilateral pulmonary nodules. No follow-up needed if patient
is low-risk. Non-contrast chest CT can be considered in 12 months if
patient is high-risk. This recommendation follows the consensus
statement: Guidelines for Management of Incidental Pulmonary Nodules
Detected on CT Images: From the [HOSPITAL] 7073; Radiology
FINDINGS: Coronary arteries: Normal origins.

Coronary Calcium Score: 0

Percentile: 0

Pericardium: Normal.

Ascending Aorta: Normal caliber.

Non-cardiac: See separate report from [REDACTED].
IMPRESSION: Coronary calcium score of 0. This was 0 percentile for age-, race-,
and sex-matched controls.



If CAC=0, it is reasonable to withhold statin therapy and reassess
in 5 to 10 years, as long as higher risk conditions are absent
(diabetes mellitus, family history of premature CHD in first degree
relatives (males <55 years; females <65 years), cigarette smoking,
or LDL >=190 mg/dL).

If CAC is 1 to 99, it is reasonable to initiate statin therapy for
patients >=55 years of age.

If CAC is >=100 or >=75th percentile, it is reasonable to initiate
statin therapy at any age.

Cardiology referral should be considered for patients with CAC
scores >=400 or >=75th percentile.

*3931 AHA/ACC/AACVPR/AAPA/ABC/NASEEM/DATABEX/HAG SUNG/[employer]/FRANNY/NOMASIBULELE/EBI
Guideline on the Management of Blood Cholesterol: A Report of the
American College of Cardiology/American Heart Association Task Force
on Clinical Practice Guidelines. J Am Coll Cardiol.
1068;73(24):3485-3681.

*** End of Addendum ***
EXAM:
OVER-READ INTERPRETATION  CT CHEST

The following report is an over-read performed by radiologist Dr.
over-read does not include interpretation of cardiac or coronary
anatomy or pathology. The calcium score interpretation by the
cardiologist is attached.
FINDINGS: Vascular: Normal aortic caliber.

Mediastinum/Nodes: No imaged thoracic adenopathy.

Lungs/Pleura: No pleural fluid. Thickening along the left major
fissure.

Subpleural 4 mm left lower lobe pulmonary nodule on 47/4.

A right middle lobe 3 mm solid nodule on [DATE].

Left lower lobe scarring.

Upper Abdomen: Normal imaged portions of the liver, spleen, stomach.
Normal aortic caliber.

Musculoskeletal: No acute osseous abnormality.
IMPRESSION: 1. No acute process in the imaged extracardiac chest.
2. Tiny bilateral pulmonary nodules. No follow-up needed if patient
is low-risk. Non-contrast chest CT can be considered in 12 months if
patient is high-risk. This recommendation follows the consensus
statement: Guidelines for Management of Incidental Pulmonary Nodules
Detected on CT Images: From the [HOSPITAL] 7073; Radiology

## 2023-09-26 DIAGNOSIS — D2272 Melanocytic nevi of left lower limb, including hip: Secondary | ICD-10-CM | POA: Diagnosis not present

## 2023-09-26 DIAGNOSIS — D485 Neoplasm of uncertain behavior of skin: Secondary | ICD-10-CM | POA: Diagnosis not present

## 2023-10-27 DIAGNOSIS — M8589 Other specified disorders of bone density and structure, multiple sites: Secondary | ICD-10-CM | POA: Diagnosis not present

## 2023-10-27 DIAGNOSIS — M8580 Other specified disorders of bone density and structure, unspecified site: Secondary | ICD-10-CM | POA: Diagnosis not present

## 2023-10-27 DIAGNOSIS — M19049 Primary osteoarthritis, unspecified hand: Secondary | ICD-10-CM | POA: Diagnosis not present

## 2023-10-27 DIAGNOSIS — G4700 Insomnia, unspecified: Secondary | ICD-10-CM | POA: Diagnosis not present

## 2023-10-27 DIAGNOSIS — G609 Hereditary and idiopathic neuropathy, unspecified: Secondary | ICD-10-CM | POA: Diagnosis not present

## 2023-10-27 DIAGNOSIS — G4710 Hypersomnia, unspecified: Secondary | ICD-10-CM | POA: Diagnosis not present

## 2023-10-27 DIAGNOSIS — D649 Anemia, unspecified: Secondary | ICD-10-CM | POA: Diagnosis not present

## 2023-10-27 DIAGNOSIS — Z23 Encounter for immunization: Secondary | ICD-10-CM | POA: Diagnosis not present

## 2023-10-27 DIAGNOSIS — F39 Unspecified mood [affective] disorder: Secondary | ICD-10-CM | POA: Diagnosis not present

## 2023-10-27 DIAGNOSIS — Z79899 Other long term (current) drug therapy: Secondary | ICD-10-CM | POA: Diagnosis not present

## 2023-10-27 DIAGNOSIS — I340 Nonrheumatic mitral (valve) insufficiency: Secondary | ICD-10-CM | POA: Diagnosis not present

## 2023-10-27 IMAGING — MG DIGITAL DIAGNOSTIC UNILAT LEFT W/ CAD
3 series · 3 of 3 positions shown · non-contrast
Comparison: Previous exam(s).

CLINICAL DATA: 65-year-old female for further evaluation of LEFT
breast calcifications, which appeared different on recent screening
mammogram

EXAM:
DIGITAL DIAGNOSTIC UNILATERAL LEFT MAMMOGRAM WITH CAD
TECHNIQUE: Left digital diagnostic mammography was performed. Mammographic
images were processed with CAD.

[L CC]
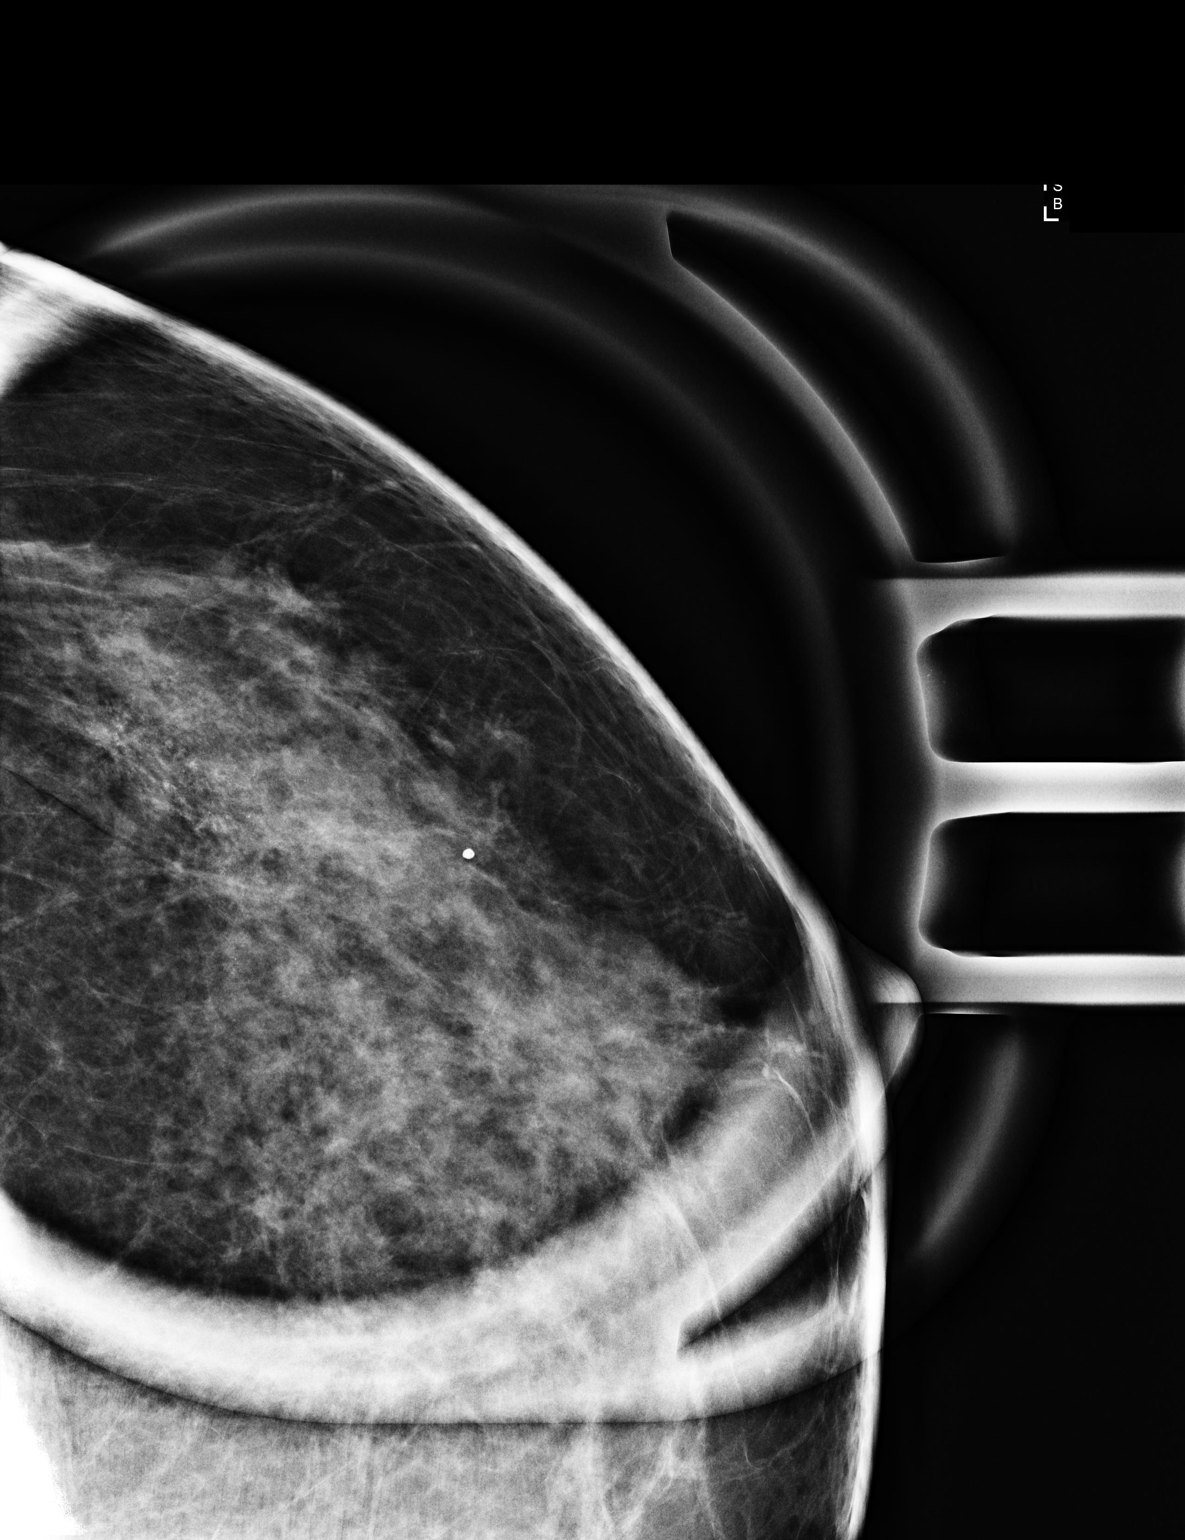

[L ML (1 of 2)]
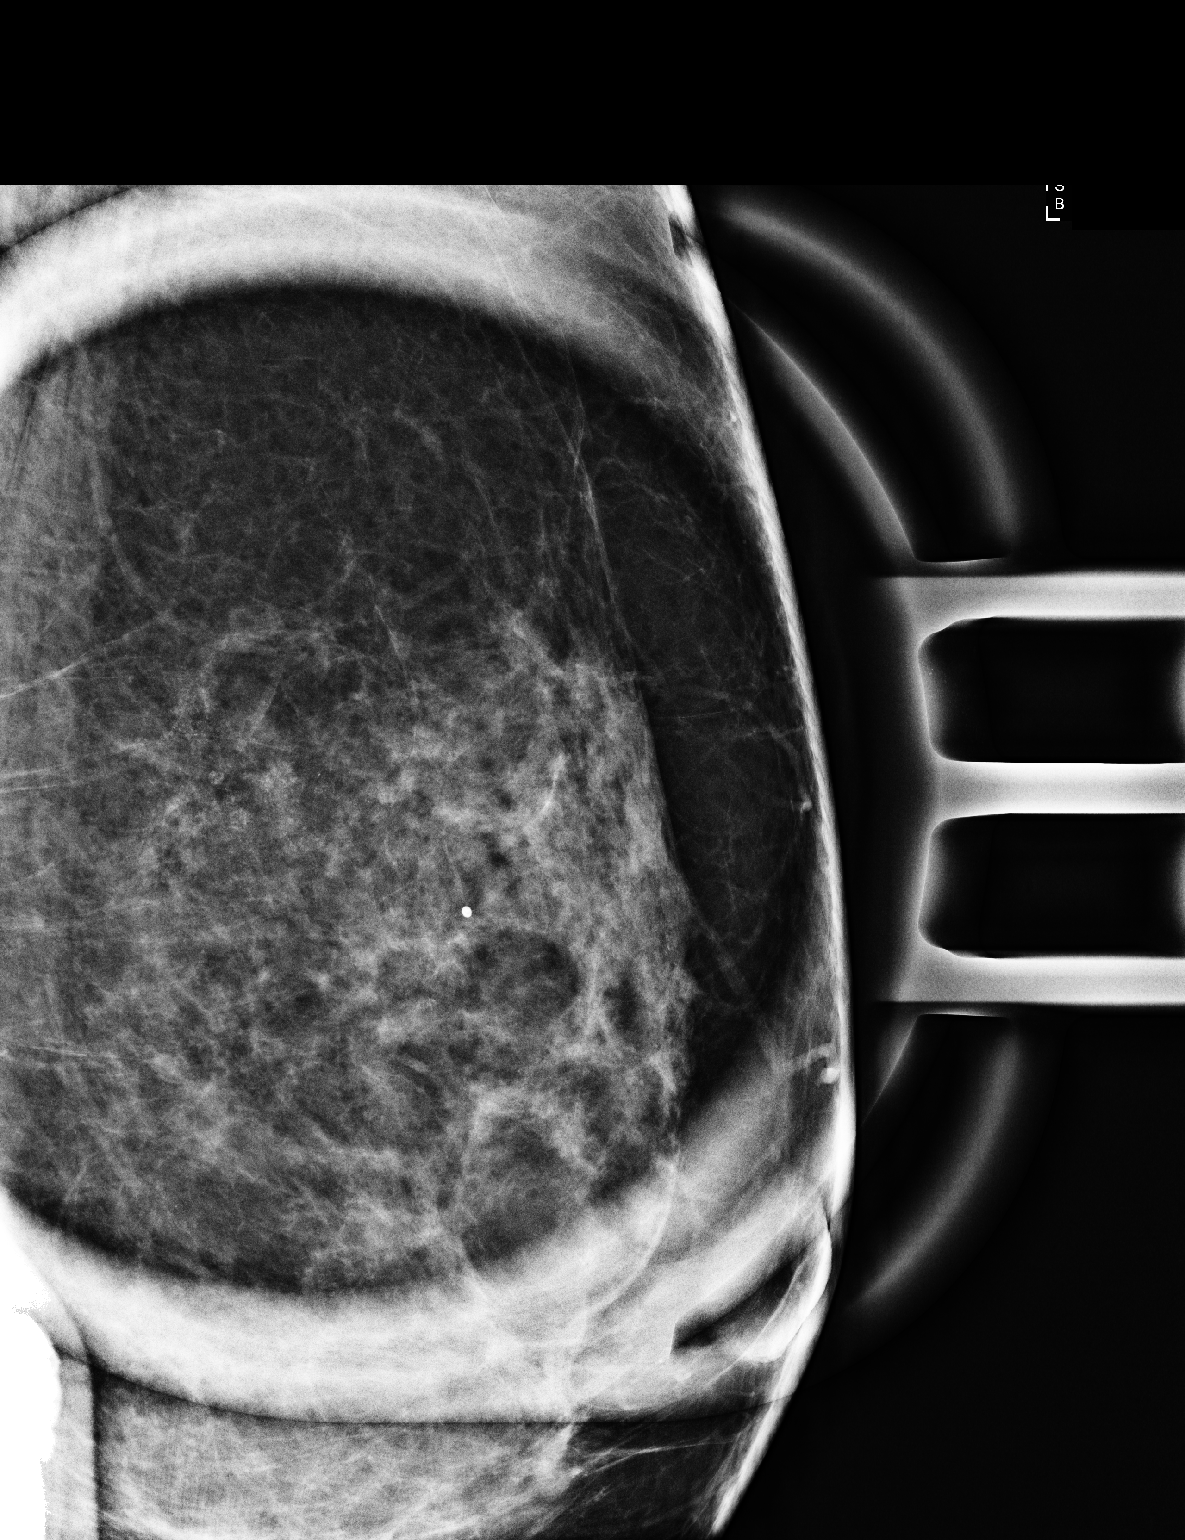

[L ML (2 of 2)]
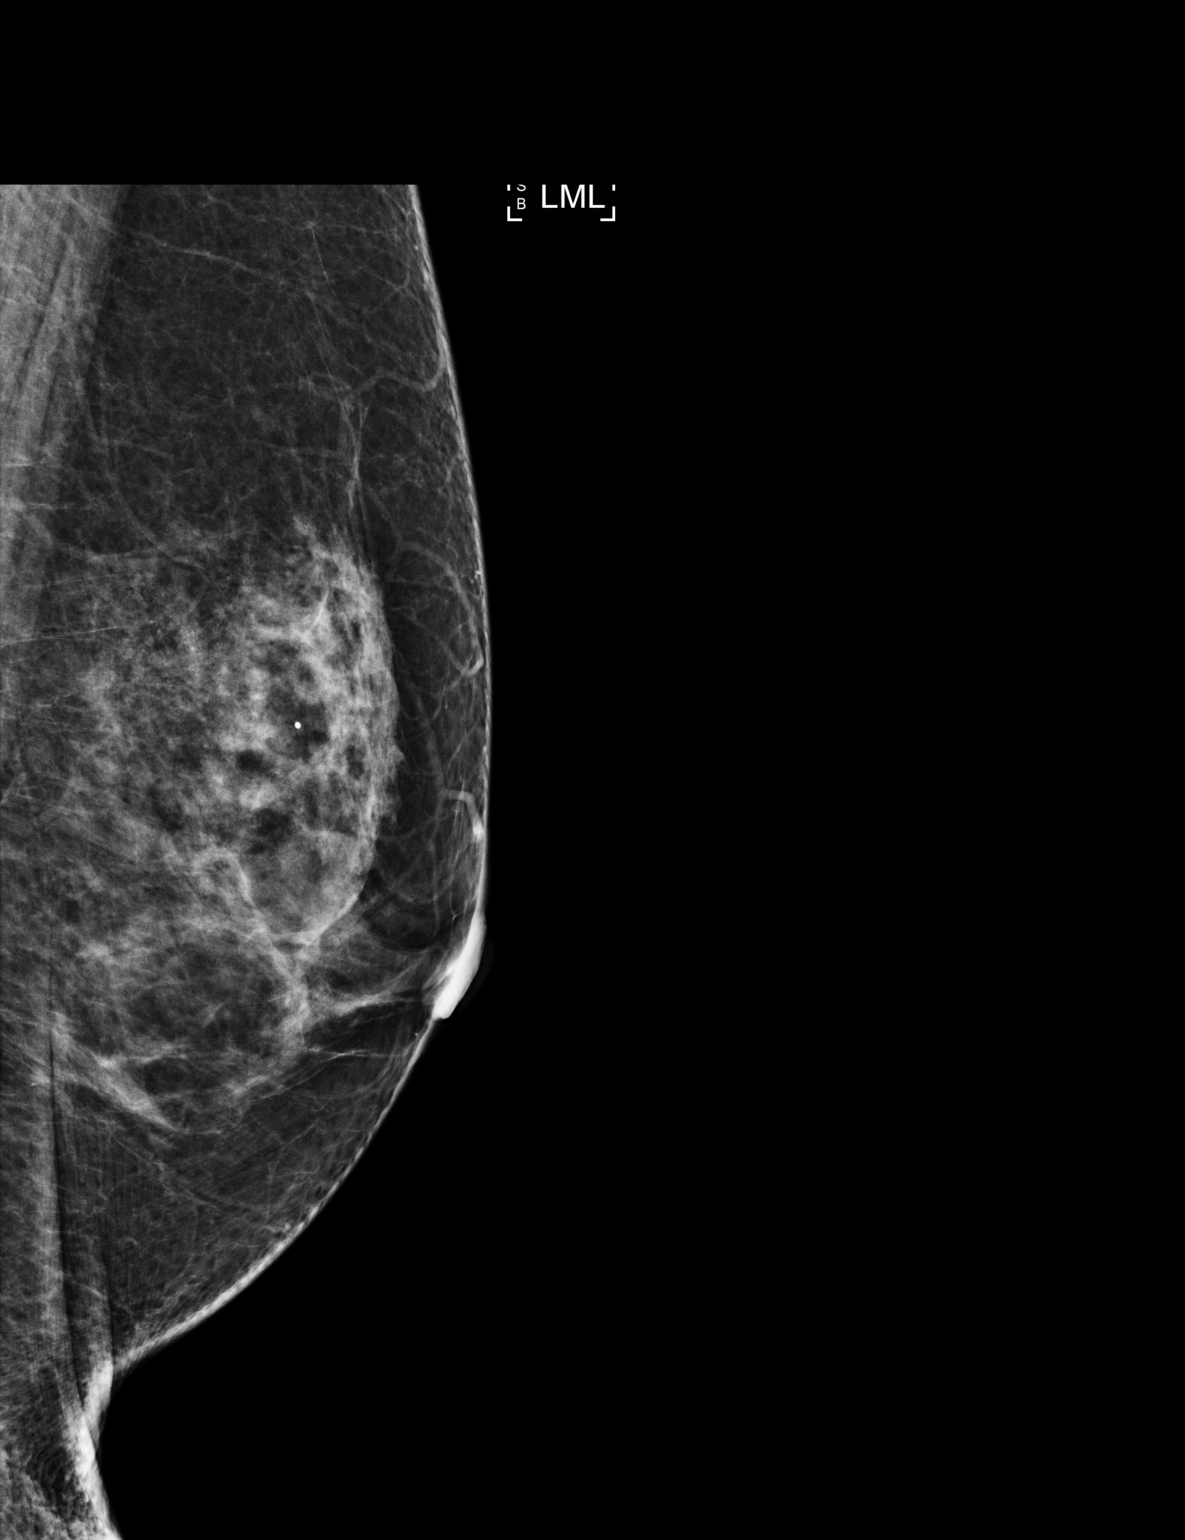

[3 of 3 positions shown; findings below may reference images not displayed]

ACR Breast Density Category c: The breast tissue is heterogeneously
dense, which may obscure small masses.
FINDINGS: Full field and magnification views of the LEFT breast demonstrate a
2.6 cm group of faint calcifications within the posterior
UPPER-OUTER LEFT breast. On today's images, these calcifications are
unchanged from at least 4395 and considered benign.
IMPRESSION: On today's images, UPPER-OUTER LEFT breast calcifications are
unchanged from at least 4395 and considered benign given long-term
stability.

RECOMMENDATION:
Bilateral screening mammogram in 1 year.

I have discussed the findings and recommendations with the patient.
If applicable, a reminder letter will be sent to the patient
regarding the next appointment.

BI-RADS CATEGORY  2: Benign.

## 2023-11-03 DIAGNOSIS — G4733 Obstructive sleep apnea (adult) (pediatric): Secondary | ICD-10-CM | POA: Diagnosis not present

## 2023-11-03 DIAGNOSIS — R0602 Shortness of breath: Secondary | ICD-10-CM | POA: Diagnosis not present

## 2023-11-05 DIAGNOSIS — C44729 Squamous cell carcinoma of skin of left lower limb, including hip: Secondary | ICD-10-CM | POA: Diagnosis not present

## 2023-11-06 DIAGNOSIS — R0602 Shortness of breath: Secondary | ICD-10-CM | POA: Diagnosis not present

## 2023-11-06 DIAGNOSIS — G4733 Obstructive sleep apnea (adult) (pediatric): Secondary | ICD-10-CM | POA: Diagnosis not present

## 2024-01-27 DIAGNOSIS — Z1231 Encounter for screening mammogram for malignant neoplasm of breast: Secondary | ICD-10-CM | POA: Diagnosis not present

## 2024-01-30 DIAGNOSIS — Z1231 Encounter for screening mammogram for malignant neoplasm of breast: Secondary | ICD-10-CM | POA: Diagnosis not present

## 2024-02-01 DIAGNOSIS — M353 Polymyalgia rheumatica: Secondary | ICD-10-CM | POA: Diagnosis not present

## 2024-02-01 DIAGNOSIS — G4733 Obstructive sleep apnea (adult) (pediatric): Secondary | ICD-10-CM | POA: Diagnosis not present

## 2024-02-01 DIAGNOSIS — Z0000 Encounter for general adult medical examination without abnormal findings: Secondary | ICD-10-CM | POA: Diagnosis not present

## 2024-02-01 DIAGNOSIS — Z6825 Body mass index (BMI) 25.0-25.9, adult: Secondary | ICD-10-CM | POA: Diagnosis not present

## 2024-02-06 DIAGNOSIS — H5203 Hypermetropia, bilateral: Secondary | ICD-10-CM | POA: Diagnosis not present

## 2024-02-06 DIAGNOSIS — H25813 Combined forms of age-related cataract, bilateral: Secondary | ICD-10-CM | POA: Diagnosis not present

## 2024-02-06 DIAGNOSIS — H52223 Regular astigmatism, bilateral: Secondary | ICD-10-CM | POA: Diagnosis not present

## 2024-02-06 DIAGNOSIS — H524 Presbyopia: Secondary | ICD-10-CM | POA: Diagnosis not present

## 2024-02-24 DIAGNOSIS — D225 Melanocytic nevi of trunk: Secondary | ICD-10-CM | POA: Diagnosis not present

## 2024-02-24 DIAGNOSIS — D485 Neoplasm of uncertain behavior of skin: Secondary | ICD-10-CM | POA: Diagnosis not present

## 2024-02-24 DIAGNOSIS — C44729 Squamous cell carcinoma of skin of left lower limb, including hip: Secondary | ICD-10-CM | POA: Diagnosis not present

## 2024-02-24 DIAGNOSIS — D2239 Melanocytic nevi of other parts of face: Secondary | ICD-10-CM | POA: Diagnosis not present

## 2024-02-24 DIAGNOSIS — L821 Other seborrheic keratosis: Secondary | ICD-10-CM | POA: Diagnosis not present

## 2024-02-24 DIAGNOSIS — L570 Actinic keratosis: Secondary | ICD-10-CM | POA: Diagnosis not present

## 2024-02-29 DIAGNOSIS — M25811 Other specified joint disorders, right shoulder: Secondary | ICD-10-CM | POA: Diagnosis not present

## 2024-03-09 DIAGNOSIS — C44729 Squamous cell carcinoma of skin of left lower limb, including hip: Secondary | ICD-10-CM | POA: Diagnosis not present

## 2024-03-15 DIAGNOSIS — M7912 Myalgia of auxiliary muscles, head and neck: Secondary | ICD-10-CM | POA: Diagnosis not present

## 2024-03-15 DIAGNOSIS — M9903 Segmental and somatic dysfunction of lumbar region: Secondary | ICD-10-CM | POA: Diagnosis not present

## 2024-03-15 DIAGNOSIS — M9902 Segmental and somatic dysfunction of thoracic region: Secondary | ICD-10-CM | POA: Diagnosis not present

## 2024-03-15 DIAGNOSIS — S39012A Strain of muscle, fascia and tendon of lower back, initial encounter: Secondary | ICD-10-CM | POA: Diagnosis not present

## 2024-03-15 DIAGNOSIS — M9901 Segmental and somatic dysfunction of cervical region: Secondary | ICD-10-CM | POA: Diagnosis not present

## 2024-03-15 DIAGNOSIS — S336XXA Sprain of sacroiliac joint, initial encounter: Secondary | ICD-10-CM | POA: Diagnosis not present

## 2024-03-15 DIAGNOSIS — M9905 Segmental and somatic dysfunction of pelvic region: Secondary | ICD-10-CM | POA: Diagnosis not present

## 2024-03-15 DIAGNOSIS — M5383 Other specified dorsopathies, cervicothoracic region: Secondary | ICD-10-CM | POA: Diagnosis not present

## 2024-03-16 DIAGNOSIS — I10 Essential (primary) hypertension: Secondary | ICD-10-CM | POA: Diagnosis not present

## 2024-03-16 DIAGNOSIS — R1084 Generalized abdominal pain: Secondary | ICD-10-CM | POA: Diagnosis not present

## 2024-03-16 DIAGNOSIS — R110 Nausea: Secondary | ICD-10-CM | POA: Diagnosis not present

## 2024-03-16 DIAGNOSIS — Z20822 Contact with and (suspected) exposure to covid-19: Secondary | ICD-10-CM | POA: Diagnosis not present

## 2024-03-16 DIAGNOSIS — R112 Nausea with vomiting, unspecified: Secondary | ICD-10-CM | POA: Diagnosis not present

## 2024-03-16 DIAGNOSIS — R0789 Other chest pain: Secondary | ICD-10-CM | POA: Diagnosis not present

## 2024-03-16 DIAGNOSIS — R079 Chest pain, unspecified: Secondary | ICD-10-CM | POA: Diagnosis not present

## 2024-03-16 DIAGNOSIS — Z90710 Acquired absence of both cervix and uterus: Secondary | ICD-10-CM | POA: Diagnosis not present

## 2024-03-16 DIAGNOSIS — Z9049 Acquired absence of other specified parts of digestive tract: Secondary | ICD-10-CM | POA: Diagnosis not present

## 2024-03-17 DIAGNOSIS — R079 Chest pain, unspecified: Secondary | ICD-10-CM | POA: Diagnosis not present

## 2024-03-19 DIAGNOSIS — M5383 Other specified dorsopathies, cervicothoracic region: Secondary | ICD-10-CM | POA: Diagnosis not present

## 2024-03-19 DIAGNOSIS — M7912 Myalgia of auxiliary muscles, head and neck: Secondary | ICD-10-CM | POA: Diagnosis not present

## 2024-03-19 DIAGNOSIS — M9903 Segmental and somatic dysfunction of lumbar region: Secondary | ICD-10-CM | POA: Diagnosis not present

## 2024-03-19 DIAGNOSIS — M9902 Segmental and somatic dysfunction of thoracic region: Secondary | ICD-10-CM | POA: Diagnosis not present

## 2024-03-19 DIAGNOSIS — M9901 Segmental and somatic dysfunction of cervical region: Secondary | ICD-10-CM | POA: Diagnosis not present

## 2024-03-19 DIAGNOSIS — M9905 Segmental and somatic dysfunction of pelvic region: Secondary | ICD-10-CM | POA: Diagnosis not present

## 2024-03-19 DIAGNOSIS — S336XXA Sprain of sacroiliac joint, initial encounter: Secondary | ICD-10-CM | POA: Diagnosis not present

## 2024-03-19 DIAGNOSIS — S39012A Strain of muscle, fascia and tendon of lower back, initial encounter: Secondary | ICD-10-CM | POA: Diagnosis not present

## 2024-04-02 DIAGNOSIS — R1032 Left lower quadrant pain: Secondary | ICD-10-CM | POA: Diagnosis not present

## 2024-04-02 DIAGNOSIS — Z6824 Body mass index (BMI) 24.0-24.9, adult: Secondary | ICD-10-CM | POA: Diagnosis not present

## 2024-05-03 DIAGNOSIS — R921 Mammographic calcification found on diagnostic imaging of breast: Secondary | ICD-10-CM | POA: Diagnosis not present

## 2024-05-03 DIAGNOSIS — R928 Other abnormal and inconclusive findings on diagnostic imaging of breast: Secondary | ICD-10-CM | POA: Diagnosis not present

## 2024-07-14 DIAGNOSIS — D485 Neoplasm of uncertain behavior of skin: Secondary | ICD-10-CM | POA: Diagnosis not present

## 2024-07-14 DIAGNOSIS — L570 Actinic keratosis: Secondary | ICD-10-CM | POA: Diagnosis not present

## 2024-07-14 DIAGNOSIS — C44729 Squamous cell carcinoma of skin of left lower limb, including hip: Secondary | ICD-10-CM | POA: Diagnosis not present

## 2024-07-19 DIAGNOSIS — N8110 Cystocele, unspecified: Secondary | ICD-10-CM | POA: Diagnosis not present

## 2024-07-19 DIAGNOSIS — N952 Postmenopausal atrophic vaginitis: Secondary | ICD-10-CM | POA: Diagnosis not present

## 2024-07-28 DIAGNOSIS — C44729 Squamous cell carcinoma of skin of left lower limb, including hip: Secondary | ICD-10-CM | POA: Diagnosis not present

## 2024-10-10 DIAGNOSIS — N393 Stress incontinence (female) (male): Secondary | ICD-10-CM | POA: Diagnosis not present

## 2024-10-10 DIAGNOSIS — N8111 Cystocele, midline: Secondary | ICD-10-CM | POA: Diagnosis not present

## 2024-10-10 DIAGNOSIS — R3915 Urgency of urination: Secondary | ICD-10-CM | POA: Diagnosis not present

## 2024-10-10 DIAGNOSIS — R350 Frequency of micturition: Secondary | ICD-10-CM | POA: Diagnosis not present

## 2024-11-03 DIAGNOSIS — C44729 Squamous cell carcinoma of skin of left lower limb, including hip: Secondary | ICD-10-CM | POA: Diagnosis not present
